# Patient Record
Sex: Female | Born: 1945 | Race: White | Hispanic: No | Marital: Married | Smoking: Never smoker
Health system: Southern US, Community
[De-identification: ages and names within clinical notes are randomized; demographics above are authoritative.]

## PROBLEM LIST (undated history)

## (undated) DIAGNOSIS — E7800 Pure hypercholesterolemia, unspecified: Secondary | ICD-10-CM

## (undated) DIAGNOSIS — J309 Allergic rhinitis, unspecified: Secondary | ICD-10-CM

## (undated) HISTORY — PX: APPENDECTOMY: SHX54

---

## 1999-01-28 ENCOUNTER — Other Ambulatory Visit: Admission: RE | Admit: 1999-01-28 | Discharge: 1999-01-28 | Payer: Self-pay | Admitting: Gynecology

## 2002-01-20 ENCOUNTER — Other Ambulatory Visit: Admission: RE | Admit: 2002-01-20 | Discharge: 2002-01-20 | Payer: Self-pay | Admitting: Gynecology

## 2003-03-29 ENCOUNTER — Other Ambulatory Visit: Admission: RE | Admit: 2003-03-29 | Discharge: 2003-03-29 | Payer: Self-pay | Admitting: Gynecology

## 2004-04-04 ENCOUNTER — Other Ambulatory Visit: Admission: RE | Admit: 2004-04-04 | Discharge: 2004-04-04 | Payer: Self-pay | Admitting: Gynecology

## 2005-06-06 ENCOUNTER — Other Ambulatory Visit: Admission: RE | Admit: 2005-06-06 | Discharge: 2005-06-06 | Payer: Self-pay | Admitting: Gynecology

## 2007-06-25 ENCOUNTER — Other Ambulatory Visit: Payer: Self-pay

## 2007-06-25 ENCOUNTER — Emergency Department: Payer: Self-pay | Admitting: Internal Medicine

## 2007-06-28 ENCOUNTER — Ambulatory Visit: Payer: Self-pay | Admitting: Cardiology

## 2007-06-30 ENCOUNTER — Encounter: Payer: Self-pay | Admitting: Cardiology

## 2007-06-30 ENCOUNTER — Ambulatory Visit: Payer: Self-pay

## 2011-02-25 NOTE — Assessment & Plan Note (Signed)
[facility] OFFICE NOTE   NAME:Hammond, Caroline                        MRN:          [id number]  DATE:06/28/2007                            DOB:          [birth year]    I was asked by Dr. Mindi Junker to consult on Caroline Hammond, a very pleasant  65 year old married white female for syncope.   This past Friday on June 25, 2007, she was doing her regular  workout at [employer]. She had done the treadmill, was doing weights.  After a particular set of weights she felt dizzy. She also felt a little  short of breath. She then had a syncopal event.   She came to, she was clear. She was kind of in and out before EMS  arrived. She had no seizure activity noted.   She was taken to [facility]. Her evaluation there  included a head CT which showed no acute abnormality, blood work  including a CBC, chemistry profile, and urinalysis, D-Dimer, and  troponin. Specifically, her troponin and D-Dimer was negative. Her CBC  was normal. Her chemistry showed that she probably was prerenal with a  BUN of 19 and a creatinine of 0.9. Of note she had not eaten breakfast  that morning which was her routine. Her urinalysis showed some  microscopic hematuria. She has had a history of interstitial cystitis.   The EKG was completely normal.   She has no previous history of syncope except when her son was like 12  years old getting stitched up.   PAST MEDICAL HISTORY:  She has no primary care physician. She has no dye  allergies. She is on no medication. She has no allergy to medications.   She does not smoke and does not use any over-the-counter medications.  She drinks maybe 1 or 2 glasses of wine a month. She drinks socially  caffeine.   She exercises about 5 days a week.   FAMILY HISTORY:  Negative for premature coronary disease.   Her only surgery is an appendectomy in 1955.   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   She is married and has 2 children.   REVIEW OF SYSTEMS:  Other than the HPI is totally negative.   PHYSICAL EXAMINATION:  Her blood pressure was 116/82, with a pulse of 74  and regular. Her blood pressure increased to 132/78 sitting with a heart  rate of 73, standing 138/89 and 74, and after 2 minutes dropped to  128/86 heart rate of 76. She is 5 feet 4 inches, weighs 186 pounds.  IN GENERAL: She is in no acute distress. She looks younger than her  stated age.  HEENT: Normocephalic, atraumatic, PERRLA: Extraocular movements intact.  Sclera clear. Facial symmetry normal.  NECK: Supple, carotids are full without bruits. There is no JVD. Thyroid  is not enlarged. Trachea is midline.  LUNGS: Clear.  HEART: Reveals a nondisplaced PMI. She has normal S1, S2.  ABDOMINAL EXAM: Soft, good bowel sounds. No midline bruits.  EXTREMITIES: Reveal  no edema. There is no sign of DVT. Pulses are  intact.  NEURO EXAM: Grossly intact.  SKIN: Unremarkable.   Please note in the emergency that her systolic pressures on several  occasions were between 60 and 80 systolic for a pretty good period of  time.   ASSESSMENT:  1. Vasovagal syncope probably in the setting of dehydration and      possibly even hypoglycemia from not eating breakfast.  2. History of remote syncope which may suggest a neurogenic or      neurocardiogenic component to this.  3. No significant orthostatic changes in the office other than after      standing for 2 minutes which were borderline in significance.   PLAN:  1. Patient counseled on the pathogenesis of neurocardiogenic syncope      or vasovagal syncope. She has been instructed and educated on      predisposing factors including dehydration and not eating. She      gained a good understanding of this during our visit.  2. A 2D echocardiogram to rule out any structural heart disease.   Assuming that #2  is normal and she has no further events, we will see  her back on a p.r.n. basis.     Thomas C. Daleen Squibb, MD, [facility]  Electronically Signed    TCW/MedQ  DD: 06/28/2007  DT: 06/28/2007  Job #: [id number]   cc:   [place]

## 2011-09-24 ENCOUNTER — Emergency Department: Payer: Self-pay | Admitting: Unknown Physician Specialty

## 2012-04-12 DEATH — deceased

## 2014-02-15 ENCOUNTER — Ambulatory Visit: Payer: Self-pay | Admitting: Family Medicine

## 2015-01-31 DIAGNOSIS — E784 Other hyperlipidemia: Secondary | ICD-10-CM | POA: Diagnosis not present

## 2015-01-31 DIAGNOSIS — Z131 Encounter for screening for diabetes mellitus: Secondary | ICD-10-CM | POA: Diagnosis not present

## 2015-02-05 DIAGNOSIS — E7800 Pure hypercholesterolemia, unspecified: Secondary | ICD-10-CM | POA: Insufficient documentation

## 2015-02-05 DIAGNOSIS — Z1211 Encounter for screening for malignant neoplasm of colon: Secondary | ICD-10-CM | POA: Diagnosis not present

## 2015-02-05 DIAGNOSIS — Z0000 Encounter for general adult medical examination without abnormal findings: Secondary | ICD-10-CM | POA: Diagnosis not present

## 2015-02-05 DIAGNOSIS — Z131 Encounter for screening for diabetes mellitus: Secondary | ICD-10-CM | POA: Diagnosis not present

## 2015-02-05 DIAGNOSIS — Z23 Encounter for immunization: Secondary | ICD-10-CM | POA: Diagnosis not present

## 2015-02-05 DIAGNOSIS — J302 Other seasonal allergic rhinitis: Secondary | ICD-10-CM | POA: Insufficient documentation

## 2015-05-16 DIAGNOSIS — E780 Pure hypercholesterolemia: Secondary | ICD-10-CM | POA: Diagnosis not present

## 2015-05-16 DIAGNOSIS — Z79899 Other long term (current) drug therapy: Secondary | ICD-10-CM | POA: Diagnosis not present

## 2015-05-24 DIAGNOSIS — Z131 Encounter for screening for diabetes mellitus: Secondary | ICD-10-CM | POA: Diagnosis not present

## 2015-05-24 DIAGNOSIS — E780 Pure hypercholesterolemia: Secondary | ICD-10-CM | POA: Diagnosis not present

## 2015-06-06 ENCOUNTER — Encounter: Payer: Self-pay | Admitting: *Deleted

## 2015-06-07 ENCOUNTER — Encounter
Admission: RE | Disposition: A | Discharge status: Home / Self Care | Payer: Self-pay | Source: Ambulatory Visit | Attending: Gastroenterology

## 2015-06-07 ENCOUNTER — Encounter: Payer: Self-pay | Admitting: Anesthesiology

## 2015-06-07 ENCOUNTER — Ambulatory Visit
Admission: RE | Admit: 2015-06-07 | Discharge: 2015-06-07 | Disposition: A | Discharge status: Home / Self Care | Payer: Medicare Other | Source: Ambulatory Visit | Attending: Gastroenterology | Admitting: Gastroenterology

## 2015-06-07 ENCOUNTER — Ambulatory Visit: Payer: Medicare Other | Admitting: Anesthesiology

## 2015-06-07 DIAGNOSIS — Q438 Other specified congenital malformations of intestine: Secondary | ICD-10-CM | POA: Insufficient documentation

## 2015-06-07 DIAGNOSIS — Z1211 Encounter for screening for malignant neoplasm of colon: Secondary | ICD-10-CM | POA: Insufficient documentation

## 2015-06-07 DIAGNOSIS — Z86010 Personal history of colonic polyps: Secondary | ICD-10-CM | POA: Diagnosis not present

## 2015-06-07 DIAGNOSIS — K5730 Diverticulosis of large intestine without perforation or abscess without bleeding: Secondary | ICD-10-CM | POA: Diagnosis not present

## 2015-06-07 DIAGNOSIS — Z800 Family history of malignant neoplasm of digestive organs: Secondary | ICD-10-CM | POA: Insufficient documentation

## 2015-06-07 DIAGNOSIS — J309 Allergic rhinitis, unspecified: Secondary | ICD-10-CM | POA: Diagnosis not present

## 2015-06-07 DIAGNOSIS — K5790 Diverticulosis of intestine, part unspecified, without perforation or abscess without bleeding: Secondary | ICD-10-CM | POA: Diagnosis not present

## 2015-06-07 DIAGNOSIS — E780 Pure hypercholesterolemia: Secondary | ICD-10-CM | POA: Diagnosis not present

## 2015-06-07 DIAGNOSIS — Z8371 Family history of colonic polyps: Secondary | ICD-10-CM | POA: Diagnosis not present

## 2015-06-07 HISTORY — DX: Allergic rhinitis, unspecified: J30.9

## 2015-06-07 HISTORY — DX: Pure hypercholesterolemia, unspecified: E78.00

## 2015-06-07 HISTORY — PX: COLONOSCOPY WITH PROPOFOL: SHX5780

## 2015-06-07 SURGERY — COLONOSCOPY WITH PROPOFOL
Anesthesia: General

## 2015-06-07 MED ORDER — SODIUM CHLORIDE 0.9 % IV SOLN: INTRAVENOUS | Status: DC

## 2015-06-07 MED ORDER — MIDAZOLAM HCL 2 MG/2ML IJ SOLN
INTRAMUSCULAR | Status: DC | PRN
  Administered 2015-06-07: 1 mg via INTRAVENOUS

## 2015-06-07 MED ORDER — EPHEDRINE SULFATE 50 MG/ML IJ SOLN
INTRAMUSCULAR | Status: DC | PRN
  Administered 2015-06-07: 10 mg via INTRAVENOUS
  Administered 2015-06-07 (×2): 5 mg via INTRAVENOUS

## 2015-06-07 MED ORDER — PROPOFOL INFUSION 10 MG/ML OPTIME
INTRAVENOUS | Status: DC | PRN
  Administered 2015-06-07: 100 ug/kg/min via INTRAVENOUS

## 2015-06-07 MED ORDER — FENTANYL CITRATE (PF) 100 MCG/2ML IJ SOLN
INTRAMUSCULAR | Status: DC | PRN
  Administered 2015-06-07: 50 ug via INTRAVENOUS

## 2015-06-07 MED ORDER — SODIUM CHLORIDE 0.9 % IV SOLN
INTRAVENOUS | Status: DC
  Administered 2015-06-07: 1000 mL via INTRAVENOUS

## 2015-06-07 NOTE — Op Note (Signed)
[facility] Gastroenterology Patient Name: Caroline Hammond Procedure Date: 06/07/2015 8:07 AM MRN: [id number] Account #: [id number] Date of Birth: October 09, 1946 Admit Type: Outpatient Age: 69 Room: [facility] ENDO ROOM 3 Gender: Female Note Status: Finalized Procedure:         Colonoscopy Indications:       Family history of colon cancer in a first-degree relative,                     Family history of colonic polyps in a first-degree relative Providers:         Christena Deem, MD Referring MD:      Hassell Halim (Referring MD) Medicines:         Monitored Anesthesia Care Complications:     No immediate complications. Procedure:         Pre-Anesthesia Assessment:                    - ASA Grade Assessment: II - A patient with mild systemic                     disease.                    After obtaining informed consent, the colonoscope was                     passed under direct vision. Throughout the procedure, the                     patient's blood pressure, pulse, and oxygen saturations                     were monitored continuously. The Colonoscope was                     introduced through the anus and advanced to the the cecum,                     identified by appendiceal orifice and ileocecal valve. The                     colonoscopy was performed with moderate difficulty due to                     a tortuous colon. Successful completion of the procedure                     was aided by changing the patient to a supine position.                     The patient tolerated the procedure well. The quality of                     the bowel preparation was good. Findings:      The sigmoid colon and distal descending colon were significantly       redundant.      Multiple small and large-mouthed diverticula were found in the sigmoid       colon and in the descending colon.      The retroflexed view of the distal rectum and anal verge was normal and       showed  no anal or rectal abnormalities.      The exam was otherwise without abnormality.  The digital rectal exam was normal. Impression:        - Redundant colon.                    - Diverticulosis in the sigmoid colon and in the                     descending colon.                    - The distal rectum and anal verge are normal on                     retroflexion view.                    - The examination was otherwise normal.                    - No specimens collected. Recommendation:    - Repeat colonoscopy in 5 years for screening purposes. Procedure Code(s): --- Professional ---                    [phone], Colonoscopy, flexible; diagnostic, including                     collection of specimen(s) by brushing or washing, when                     performed (separate procedure) Diagnosis Code(s): --- Professional ---                    V16.0, Family history of malignant neoplasm of                     gastrointestinal tract                    V18.51, Family history of colonic polyps                    562.10, Diverticulosis of colon (without mention of                     hemorrhage)                    751.5, Other anomalies of intestine CPT copyright 2014 American Medical Association. All rights reserved. The codes documented in this report are preliminary and upon coder review may  be revised to meet current compliance requirements. Christena Deem, MD 06/07/2015 8:41:52 AM This report has been signed electronically. Number of Addenda: 0 Note Initiated On: 06/07/2015 8:07 AM Scope Withdrawal Time: 0 hours 7 minutes 48 seconds  Total Procedure Duration: 0 hours 19 minutes 14 seconds       [facility]

## 2015-06-07 NOTE — Transfer of Care (Signed)
Immediate Anesthesia Transfer of Care Note  Patient: Caroline Hammond  Procedure(s) Performed: Procedure(s): COLONOSCOPY WITH PROPOFOL (N/A)  Patient Location: PACU  Anesthesia Type:General  Level of Consciousness: awake and sedated  Airway & Oxygen Therapy: Patient Spontanous Breathing and Patient connected to nasal cannula oxygen  Post-op Assessment: Report given to RN and Post -op Vital signs reviewed and stable  Post vital signs: Reviewed and stable  Last Vitals:  Filed Vitals:   06/07/15 0713  BP: 129/54  Pulse:   Resp: 16    Complications: No apparent anesthesia complications

## 2015-06-07 NOTE — H&P (Signed)
Outpatient short stay form Pre-procedure 06/07/2015 8:01 AM Caroline Deem MD  Primary Physician: Dr. Kandyce Rud  Reason for visit:  Colonoscopy  History of present illness:  Patient is a 69 year old female presenting for colonoscopy. She has a family history of colon cancer in a sister as well as family history of colon polyps in mother. She tolerated her prep well for the procedure. She takes no aspirin or blood thinning products.    Current facility-administered medications:  .  0.9 %  sodium chloride infusion, , Intravenous, Continuous, Caroline Deem, MD, Last Rate: 20 mL/hr at 06/07/15 0722, 1,000 mL at 06/07/15 0722 .  0.9 %  sodium chloride infusion, , Intravenous, Continuous, Caroline Deem, MD  Prescriptions prior to admission  Medication Sig Dispense Refill Last Dose  . atorvastatin (LIPITOR) 10 MG tablet Take 10 mg by mouth daily.   Not Taking at Unknown time  . fluticasone (VERAMYST) 27.5 MCG/SPRAY nasal spray Place 2 sprays into the nose daily.   Not Taking at Unknown time     Not on File   Past Medical History  Diagnosis Date  . Pure hypercholesterolemia   . Allergic rhinitis     Review of systems:      Physical Exam    Heart and lungs: Regular rate and rhythm without rub or gallop, lungs are bilaterally clear.    HEENT: Normocephalic atraumatic eyes are anicteric    Other:     Pertinant exam for procedure: Soft nontender nondistended bowel sounds positive normoactive    Planned proceedures: Colonoscopy and indicated procedures. I have discussed the risks benefits and complications of procedures to include not limited to bleeding, infection, perforation and the risk of sedation and the patient wishes to proceed.  Caroline Deem, MD Gastroenterology 06/07/2015  8:01 AM

## 2015-06-07 NOTE — Anesthesia Preprocedure Evaluation (Signed)
Anesthesia Evaluation  Patient identified by MRN, date of birth, ID band Patient awake    Reviewed: Allergy & Precautions, NPO status , Patient's Chart, lab work & pertinent test results, reviewed documented beta blocker date and time   Airway Mallampati: II  TM Distance: >3 FB     Dental  (+) Chipped   Pulmonary          Cardiovascular     Neuro/Psych    GI/Hepatic   Endo/Other    Renal/GU      Musculoskeletal   Abdominal   Peds  Hematology   Anesthesia Other Findings   Reproductive/Obstetrics                             Anesthesia Physical Anesthesia Plan  ASA: II  Anesthesia Plan: General   Post-op Pain Management:    Induction: Intravenous  Airway Management Planned: Nasal Cannula  Additional Equipment:   Intra-op Plan:   Post-operative Plan:   Informed Consent: I have reviewed the patients History and Physical, chart, labs and discussed the procedure including the risks, benefits and alternatives for the proposed anesthesia with the patient or authorized representative who has indicated his/her understanding and acceptance.     Plan Discussed with: CRNA  Anesthesia Plan Comments:         Anesthesia Quick Evaluation  

## 2015-06-07 NOTE — Anesthesia Postprocedure Evaluation (Signed)
  Anesthesia Post-op Note  Patient: Caroline Hammond  Procedure(s) Performed: Procedure(s): COLONOSCOPY WITH PROPOFOL (N/A)  Anesthesia type:General  Patient location: PACU  Post pain: Pain level controlled  Post assessment: Post-op Vital signs reviewed, Patient's Cardiovascular Status Stable, Respiratory Function Stable, Patent Airway and No signs of Nausea or vomiting  Post vital signs: Reviewed and stable  Last Vitals:  Filed Vitals:   06/07/15 0920  BP: 104/51  Pulse: 74  Temp:   Resp: 17    Level of consciousness: awake, alert  and patient cooperative  Complications: No apparent anesthesia complications

## 2015-06-07 NOTE — Anesthesia Procedure Notes (Signed)
Performed by: Tonia Ghent Pre-anesthesia Checklist: Patient identified, Emergency Drugs available, Suction available, Timeout performed and Patient being monitored Patient Re-evaluated:Patient Re-evaluated prior to inductionOxygen Delivery Method: Nasal cannula Preoxygenation: Pre-oxygenation with 100% oxygen Intubation Type: IV induction Endobronchial tube: Left Placement Confirmation: CO2 detector and positive ETCO2

## 2015-06-08 ENCOUNTER — Encounter: Payer: Self-pay | Admitting: Gastroenterology

## 2015-10-24 DIAGNOSIS — H524 Presbyopia: Secondary | ICD-10-CM | POA: Diagnosis not present

## 2015-10-24 DIAGNOSIS — H52223 Regular astigmatism, bilateral: Secondary | ICD-10-CM | POA: Diagnosis not present

## 2015-10-24 DIAGNOSIS — H5203 Hypermetropia, bilateral: Secondary | ICD-10-CM | POA: Diagnosis not present

## 2016-02-05 DIAGNOSIS — E7800 Pure hypercholesterolemia, unspecified: Secondary | ICD-10-CM | POA: Diagnosis not present

## 2016-02-05 DIAGNOSIS — Z131 Encounter for screening for diabetes mellitus: Secondary | ICD-10-CM | POA: Diagnosis not present

## 2016-02-12 DIAGNOSIS — Z131 Encounter for screening for diabetes mellitus: Secondary | ICD-10-CM | POA: Diagnosis not present

## 2016-02-12 DIAGNOSIS — Z0000 Encounter for general adult medical examination without abnormal findings: Secondary | ICD-10-CM | POA: Diagnosis not present

## 2016-02-12 DIAGNOSIS — E7800 Pure hypercholesterolemia, unspecified: Secondary | ICD-10-CM | POA: Diagnosis not present

## 2016-07-22 ENCOUNTER — Other Ambulatory Visit: Payer: Self-pay | Admitting: Family Medicine

## 2016-07-22 DIAGNOSIS — Z1231 Encounter for screening mammogram for malignant neoplasm of breast: Secondary | ICD-10-CM

## 2016-08-13 DIAGNOSIS — Z131 Encounter for screening for diabetes mellitus: Secondary | ICD-10-CM | POA: Diagnosis not present

## 2016-08-13 DIAGNOSIS — E7800 Pure hypercholesterolemia, unspecified: Secondary | ICD-10-CM | POA: Diagnosis not present

## 2016-08-18 ENCOUNTER — Ambulatory Visit: Payer: Medicare Other

## 2016-08-19 ENCOUNTER — Other Ambulatory Visit: Payer: Self-pay | Admitting: Family Medicine

## 2016-08-19 ENCOUNTER — Ambulatory Visit
Admission: RE | Admit: 2016-08-19 | Discharge: 2016-08-19 | Disposition: A | Discharge status: Home / Self Care | Payer: Medicare Other | Source: Ambulatory Visit | Attending: Family Medicine | Admitting: Family Medicine

## 2016-08-19 DIAGNOSIS — Z1231 Encounter for screening mammogram for malignant neoplasm of breast: Secondary | ICD-10-CM | POA: Insufficient documentation

## 2016-08-19 DIAGNOSIS — E7800 Pure hypercholesterolemia, unspecified: Secondary | ICD-10-CM | POA: Diagnosis not present

## 2016-08-19 DIAGNOSIS — Z131 Encounter for screening for diabetes mellitus: Secondary | ICD-10-CM | POA: Diagnosis not present

## 2017-01-05 ENCOUNTER — Encounter: Payer: Self-pay | Admitting: *Deleted

## 2017-01-06 NOTE — Discharge Instructions (Signed)
[facility] °[facility] °ENDOSCOPIC SINUS SURGERY °[facility] ° °What is Functional Endoscopic Sinus Surgery? ° The Surgery involves making the natural openings of the sinuses larger by removing the bony partitions that separate the sinuses from the nasal cavity.  The natural sinus lining is preserved as much as possible to allow the sinuses to resume normal function after the surgery.  In some patients nasal polyps (excessively swollen lining of the sinuses) may be removed to relieve obstruction of the sinus openings.  The surgery is performed through the nose using lighted scopes, which eliminates the need for incisions on the face.  A septoplasty is a different procedure which is sometimes performed with sinus surgery.  It involves straightening the boy partition that separates the two sides of your nose.  A crooked or deviated septum may need repair if is obstructing the sinuses or nasal airflow.  Turbinate reduction is also often performed during sinus surgery.  The turbinates are bony proturberances from the side walls of the nose which swell and can obstruct the nose in patients with sinus and allergy problems.  Their size can be surgically reduced to help relieve nasal obstruction. ° °What Can Sinus Surgery Do For Me? ° Sinus surgery can reduce the frequency of sinus infections requiring antibiotic treatment.  This can provide improvement in nasal congestion, post-nasal drainage, facial pressure and nasal obstruction.  Surgery will NOT prevent you from ever having an infection again, so it usually only for patients who get infections 4 or more times yearly requiring antibiotics, or for infections that do not clear with antibiotics.  It will not cure nasal allergies, so patients with allergies may still require medication to treat their allergies after surgery. Surgery may improve headaches related to sinusitis, however, some people will continue to  require medication to control sinus headaches related to allergies.  Surgery will do nothing for other forms of headache (migraine, tension or cluster). ° °What Are the Risks of Endoscopic Sinus Surgery? ° Current techniques allow surgery to be performed safely with little risk, however, there are rare complications that patients should be aware of.  Because the sinuses are located around the eyes, there is risk of eye injury, including blindness, though again, this would be quite rare. This is usually a result of bleeding behind the eye during surgery, which puts the vision oat risk, though there are treatments to protect the vision and prevent permanent disrupted by surgery causing a leak of the spinal fluid that surrounds the brain.  More serious complications would include bleeding inside the brain cavity or damage to the brain.  Again, all of these complications are uncommon, and spinal fluid leaks can be safely managed surgically if they occur.  The most common complication of sinus surgery is bleeding from the nose, which may require packing or cauterization of the nose.  Continued sinus have polyps may experience recurrence of the polyps requiring revision surgery.  Alterations of sense of smell or injury to the tear ducts are also rare complications.  ° °What is the Surgery Like, and what is the Recovery? ° The Surgery usually takes a couple of hours to perform, and is usually performed under a general anesthetic (completely asleep).  Patients are usually discharged home after a couple of hours.  Sometimes during surgery it is necessary to pack the nose to control bleeding, and the packing is left in place for 24 - 48 hours, and removed by your surgeon.    If a septoplasty was performed during the procedure, there is often a splint placed which must be removed after 5-7 days.   °Discomfort: Pain is usually mild to moderate, and can be controlled by prescription pain medication or acetaminophen (Tylenol).   Aspirin, Ibuprofen (Advil, Motrin), or Naprosyn (Aleve) should be avoided, as they can cause increased bleeding.  Most patients feel sinus pressure like they have a bad head cold for several days.  Sleeping with your head elevated can help reduce swelling and facial pressure, as can ice packs over the face.  A humidifier may be helpful to keep the mucous and blood from drying in the nose.  ° °Diet: There are no specific diet restrictions, however, you should generally start with clear liquids and a light diet of bland foods because the anesthetic can cause some nausea.  Advance your diet depending on how your stomach feels.  Taking your pain medication with food will often help reduce stomach upset which pain medications can cause. ° °Nasal Saline Irrigation: It is important to remove blood clots and dried mucous from the nose as it is healing.  This is done by having you irrigate the nose at least 3 - 4 times daily with a salt water solution.  We recommend using NeilMed Sinus Rinse (available at the drug store).  Fill the squeeze bottle with the solution, bend over a sink, and insert the tip of the squeeze bottle into the nose ½ of an inch.  Point the tip of the squeeze bottle towards the inside corner of the eye on the same side your irrigating.  Squeeze the bottle and gently irrigate the nose.  If you bend forward as you do this, most of the fluid will flow back out of the nose, instead of down your throat.   The solution should be warm, near body temperature, when you irrigate.   Each time you irrigate, you should use a full squeeze bottle.  ° °Note that if you are instructed to use Nasal Steroid Sprays at any time after your surgery, irrigate with saline BEFORE using the steroid spray, so you do not wash it all out of the nose. °Another product, Nasal Saline Gel (such as AYR Nasal Saline Gel) can be applied in each nostril 3 - 4 times daily to moisture the nose and reduce scabbing or crusting. ° °Bleeding:   Bloody drainage from the nose can be expected for several days, and patients are instructed to irrigate their nose frequently with salt water to help remove mucous and blood clots.  The drainage may be dark red or brown, though some fresh blood may be seen intermittently, especially after irrigation.  Do not blow you nose, as bleeding may occur. If you must sneeze, keep your mouth open to allow air to escape through your mouth. ° °If heavy bleeding occurs: Irrigate the nose with saline to rinse out clots, then spray the nose 3 - 4 times with Afrin Nasal Decongestant Spray.  The spray will constrict the blood vessels to slow bleeding.  Pinch the lower half of your nose shut to apply pressure, and lay down with your head elevated.  Ice packs over the nose may help as well. If bleeding persists despite these measures, you should notify your doctor.  Do not use the Afrin routinely to control nasal congestion after surgery, as it can result in worsening congestion and may affect healing.  ° °Activity: Return to work varies among patients. Most patients will be out of   work at least 5 - 7 days to recover.  Patient may return to work after they are off of narcotic pain medication, and feeling well enough to perform the functions of their job.  Patients must avoid heavy lifting (over 10 pounds) or strenuous physical for 2 weeks after surgery, so your employer may need to assign you to light duty, or keep you out of work longer if light duty is not possible.  NOTE: you should not drive, operate dangerous machinery, do any mentally demanding tasks or make any important legal or financial decisions while on narcotic pain medication and recovering from the general anesthetic.  °  °Call Your Doctor Immediately if You Have Any of the Following: °1. Bleeding that you cannot control with the above measures °2. Loss of vision, double vision, bulging of the eye or black eyes. °3. Fever over 101 degrees °4. Neck stiffness with severe  headache, fever, nausea and change in mental state. °You are always encourage to call anytime with concerns, however, please call with requests for pain medication refills during office hours. ° °Office Endoscopy: During follow-up visits your doctor will remove any packing or splints that may have been placed and evaluate and clean your sinuses endoscopically.  Topical anesthetic will be used to make this as comfortable as possible, though you may want to take your pain medication prior to the visit.  How often this will need to be done varies from patient to patient.  After complete recovery from the surgery, you may need follow-up endoscopy from time to time, particularly if there is concern of recurrent infection or nasal polyps. ° ° °General Anesthesia, Adult, Care After °These instructions provide you with information about caring for yourself after your procedure. Your health care provider may also give you more specific instructions. Your treatment has been planned according to current medical practices, but problems sometimes occur. Call your health care provider if you have any problems or questions after your procedure. °What can I expect after the procedure? °After the procedure, it is common to have: °· Vomiting. °· A sore throat. °· Mental slowness. °It is common to feel: °· Nauseous. °· Cold or shivery. °· Sleepy. °· Tired. °· Sore or achy, even in parts of your body where you did not have surgery. °Follow these instructions at home: °For at least 24 hours after the procedure: °· Do not: °¨ Participate in activities where you could fall or become injured. °¨ Drive. °¨ Use heavy machinery. °¨ Drink alcohol. °¨ Take sleeping pills or medicines that cause drowsiness. °¨ Make important decisions or sign legal documents. °¨ Take care of children on your own. °· Rest. °Eating and drinking °· If you vomit, drink water, juice, or soup when you can drink without vomiting. °· Drink enough fluid to keep your  urine clear or pale yellow. °· Make sure you have little or no nausea before eating solid foods. °· Follow the diet recommended by your health care provider. °General instructions °· Have a responsible adult stay with you until you are awake and alert. °· Return to your normal activities as told by your health care provider. Ask your health care provider what activities are safe for you. °· Take over-the-counter and prescription medicines only as told by your health care provider. °· If you smoke, do not smoke without supervision. °· Keep all follow-up visits as told by your health care provider. This is important. °Contact a health care provider if: °· You continue to have nausea   or vomiting at home, and medicines are not helpful. °· You cannot drink fluids or start eating again. °· You cannot urinate after 8-12 hours. °· You develop a skin rash. °· You have fever. °· You have increasing redness at the site of your procedure. °Get help right away if: °· You have difficulty breathing. °· You have chest pain. °· You have unexpected bleeding. °· You feel that you are having a life-threatening or urgent problem. °This information is not intended to replace advice given to you by your health care provider. Make sure you discuss any questions you have with your health care provider. °Document Released: 01/05/2001 Document Revised: 03/03/2016 Document Reviewed: 09/13/2015 °[employer] Interactive Patient Education © 2017 [employer] ° °

## 2017-01-08 ENCOUNTER — Encounter
Admission: RE | Disposition: A | Discharge status: Home / Self Care | Payer: Self-pay | Source: Ambulatory Visit | Attending: Otolaryngology

## 2017-01-08 ENCOUNTER — Ambulatory Visit: Payer: Medicare Other | Admitting: Anesthesiology

## 2017-01-08 ENCOUNTER — Ambulatory Visit
Admission: RE | Admit: 2017-01-08 | Discharge: 2017-01-08 | Disposition: A | Discharge status: Home / Self Care | Payer: Medicare Other | Source: Ambulatory Visit | Attending: Otolaryngology | Admitting: Otolaryngology

## 2017-01-08 DIAGNOSIS — Z9889 Other specified postprocedural states: Secondary | ICD-10-CM | POA: Insufficient documentation

## 2017-01-08 DIAGNOSIS — J343 Hypertrophy of nasal turbinates: Secondary | ICD-10-CM | POA: Diagnosis not present

## 2017-01-08 DIAGNOSIS — Z79899 Other long term (current) drug therapy: Secondary | ICD-10-CM | POA: Diagnosis not present

## 2017-01-08 DIAGNOSIS — M950 Acquired deformity of nose: Secondary | ICD-10-CM | POA: Insufficient documentation

## 2017-01-08 DIAGNOSIS — Z83511 Family history of glaucoma: Secondary | ICD-10-CM | POA: Diagnosis not present

## 2017-01-08 DIAGNOSIS — J342 Deviated nasal septum: Secondary | ICD-10-CM | POA: Insufficient documentation

## 2017-01-08 DIAGNOSIS — Z8349 Family history of other endocrine, nutritional and metabolic diseases: Secondary | ICD-10-CM | POA: Diagnosis not present

## 2017-01-08 HISTORY — PX: SEPTOPLASTY: SHX2393

## 2017-01-08 HISTORY — PX: TURBINATE REDUCTION: SHX6157

## 2017-01-08 SURGERY — SEPTOPLASTY, NOSE
Anesthesia: General | Site: Nose | Laterality: Bilateral | Wound class: Clean Contaminated

## 2017-01-08 MED ORDER — LIDOCAINE HCL (CARDIAC) 20 MG/ML IV SOLN
INTRAVENOUS | Status: DC | PRN
  Administered 2017-01-08: 50 mg via INTRAVENOUS

## 2017-01-08 MED ORDER — DEXTROSE 5 % IV SOLN
2000.0000 mg | Freq: Once | INTRAVENOUS | Status: AC
  Administered 2017-01-08: 2000 mg via INTRAVENOUS

## 2017-01-08 MED ORDER — LIDOCAINE-EPINEPHRINE 1 %-1:100000 IJ SOLN
INTRAMUSCULAR | Status: DC | PRN
  Administered 2017-01-08: 2 mL
  Administered 2017-01-08: 4 mL

## 2017-01-08 MED ORDER — MIDAZOLAM HCL 5 MG/5ML IJ SOLN
INTRAMUSCULAR | Status: DC | PRN
  Administered 2017-01-08: 2 mg via INTRAVENOUS

## 2017-01-08 MED ORDER — SUCCINYLCHOLINE CHLORIDE 20 MG/ML IJ SOLN
INTRAMUSCULAR | Status: DC | PRN
  Administered 2017-01-08: 80 mg via INTRAVENOUS

## 2017-01-08 MED ORDER — DEXAMETHASONE SODIUM PHOSPHATE 4 MG/ML IJ SOLN
INTRAMUSCULAR | Status: DC | PRN
  Administered 2017-01-08: 8 mg via INTRAVENOUS

## 2017-01-08 MED ORDER — OXYCODONE HCL 5 MG PO TABS
5.0000 mg | ORAL_TABLET | Freq: Once | ORAL | Status: AC | PRN
  Administered 2017-01-08: 5 mg via ORAL

## 2017-01-08 MED ORDER — ONDANSETRON HCL 4 MG/2ML IJ SOLN: 4.0000 mg | Freq: Once | INTRAMUSCULAR | Status: DC | PRN

## 2017-01-08 MED ORDER — FENTANYL CITRATE (PF) 100 MCG/2ML IJ SOLN
INTRAMUSCULAR | Status: DC | PRN
  Administered 2017-01-08 (×2): 50 ug via INTRAVENOUS

## 2017-01-08 MED ORDER — LACTATED RINGERS IV SOLN
INTRAVENOUS | Status: DC
  Administered 2017-01-08: 11:00:00 via INTRAVENOUS

## 2017-01-08 MED ORDER — LIDOCAINE HCL 1 % IJ SOLN
INTRAMUSCULAR | Status: DC | PRN
  Administered 2017-01-08: 5 mL via TOPICAL

## 2017-01-08 MED ORDER — FENTANYL CITRATE (PF) 100 MCG/2ML IJ SOLN
25.0000 ug | INTRAMUSCULAR | Status: DC | PRN
Start: 2017-01-08 — End: 2017-01-08

## 2017-01-08 MED ORDER — ONDANSETRON HCL 4 MG/2ML IJ SOLN
INTRAMUSCULAR | Status: DC | PRN
  Administered 2017-01-08: 4 mg via INTRAVENOUS

## 2017-01-08 MED ORDER — OXYMETAZOLINE HCL 0.05 % NA SOLN
2.0000 | Freq: Once | NASAL | Status: AC
  Administered 2017-01-08: 2 via NASAL

## 2017-01-08 MED ORDER — EPHEDRINE SULFATE 50 MG/ML IJ SOLN
INTRAMUSCULAR | Status: DC | PRN
  Administered 2017-01-08 (×3): 10 mg via INTRAVENOUS

## 2017-01-08 MED ORDER — PROPOFOL 10 MG/ML IV BOLUS
INTRAVENOUS | Status: DC | PRN
  Administered 2017-01-08: 100 mg via INTRAVENOUS
  Administered 2017-01-08: 50 mg via INTRAVENOUS

## 2017-01-08 MED ORDER — ACETAMINOPHEN 10 MG/ML IV SOLN
1000.0000 mg | Freq: Once | INTRAVENOUS | Status: AC
  Administered 2017-01-08: 1000 mg via INTRAVENOUS

## 2017-01-08 MED ORDER — OXYCODONE HCL 5 MG/5ML PO SOLN: 5.0000 mg | Freq: Once | ORAL | Status: AC | PRN

## 2017-01-08 SURGICAL SUPPLY — 44 items
CANISTER SUCT 1200ML W/VALVE (MISCELLANEOUS) ×2 IMPLANT
CATH IV 18X1 1/4 SAFELET (CATHETERS) ×2 IMPLANT
COAG SUCT 10F 3.5MM HAND CTRL (MISCELLANEOUS) ×2 IMPLANT
COAGULATOR SUCT 8FR VV (MISCELLANEOUS) ×2 IMPLANT
DEVICE INFLATION 20/61 (MISCELLANEOUS) IMPLANT
DRAPE HEAD BAR (DRAPES) ×2 IMPLANT
DRESSING NASL FOAM PST OP SINU (MISCELLANEOUS) IMPLANT
DRSG NASAL 4CM NASOPORE (MISCELLANEOUS) IMPLANT
DRSG NASAL FOAM POST OP SINU (MISCELLANEOUS)
GLOVE PI ULTRA LF STRL 7.5 (GLOVE) ×2 IMPLANT
GLOVE PI ULTRA NON LATEX 7.5 (GLOVE) ×2
IMPLANT NASAL LATERA ABSORABLE (Miscellaneous) ×2 IMPLANT
IRRIGATOR 4MM STR (IRRIGATION / IRRIGATOR) ×2 IMPLANT
IV CATH 18X1 1/4 SAFELET (CATHETERS) ×1
IV NS 500ML (IV SOLUTION) ×2
IV NS 500ML BAXH (IV SOLUTION) ×1 IMPLANT
KIT ROOM TURNOVER OR (KITS) ×2 IMPLANT
NDL ANESTHESIA 27G X 3.5 (NEEDLE) ×1 IMPLANT
NDL HYPO 25GX1X1/2 BEV (NEEDLE) ×1 IMPLANT
NDL SPNL 25GX3.5 QUINCKE BL (NEEDLE) IMPLANT
NEEDLE ANESTHESIA  27G X 3.5 (NEEDLE) ×1
NEEDLE ANESTHESIA 27G X 3.5 (NEEDLE) ×1 IMPLANT
NEEDLE HYPO 25GX1X1/2 BEV (NEEDLE) ×2 IMPLANT
NEEDLE SPNL 25GX3.5 QUINCKE BL (NEEDLE) IMPLANT
NS IRRIG 500ML POUR BTL (IV SOLUTION) ×2 IMPLANT
PACK DRAPE NASAL/ENT (PACKS) ×2 IMPLANT
PACKING NASAL EPIS 4X2.4 XEROG (MISCELLANEOUS) IMPLANT
PAD GROUND ADULT SPLIT (MISCELLANEOUS) ×2 IMPLANT
PATTIES SURGICAL .5 X3 (DISPOSABLE) ×2 IMPLANT
SET HANDPIECE IRR DIEGO (MISCELLANEOUS) ×2 IMPLANT
SINUPLASTY BALLN CATHTIP (CATHETERS) IMPLANT
SOL ANTI-FOG 6CC FOG-OUT (MISCELLANEOUS) ×1 IMPLANT
SOL FOG-OUT ANTI-FOG 6CC (MISCELLANEOUS) ×1
SPLINT NASAL SEPTAL BLV .50 ST (MISCELLANEOUS) ×2 IMPLANT
STRAP BODY AND KNEE 60X3 (MISCELLANEOUS) ×2 IMPLANT
SUT CHROMIC 3-0 (SUTURE)
SUT CHROMIC 3-0 KS 27XMFL CR (SUTURE)
SUT ETHILON 3-0 KS 30 BLK (SUTURE) ×2 IMPLANT
SUT PLAIN GUT 4-0 (SUTURE) ×2 IMPLANT
SUTURE CHRMC 3-0 KS 27XMFL CR (SUTURE) IMPLANT
SYR 3ML LL SCALE MARK (SYRINGE) ×2 IMPLANT
SYSTEM BALLN SINUPLASTY 6X16 (BALLOONS) IMPLANT
TOWEL OR 17X26 4PK STRL BLUE (TOWEL DISPOSABLE) ×2 IMPLANT
WATER STERILE IRR 500ML POUR (IV SOLUTION) IMPLANT

## 2017-01-08 NOTE — Op Note (Signed)
01/08/2017  1:19 PM  [id number]   Pre-Op Dx:  Bilateral nasal valve collapse and Obstruction, Deviated Nasal Septum, Hypertrophic Inferior Turbinates  Post-op Dx: Same  Proc: Bilateral nasal valve repair, Nasal Septoplasty, Bilateral Partial Reduction Inferior Turbinates   Surg:  Noorah Giammona H  Anes:  GOT  EBL:  50 mL  Comp:  None  Findings: A she has collapsed nasal valves both size at into her nasal obstruction. She has deviated septum that had inferior spur on the right side and the ethmoid plate buckled towards the right as well. The left inferior turbinate especially was overgrown.  Procedure: With the patient in a comfortable supine position,  general orotracheal anesthesia was induced without difficulty.     The patient received preoperative Afrin spray for topical decongestion and vasoconstriction.  Intravenous prophylactic antibiotics were administered.  At an appropriate level, the patient was placed in a semi-sitting position.  Nasal vibrissae were trimmed.   1/4% marcaine  with 1:100,000 epinephrine, 4 cc's, was infiltrated into the anterior floor of the nose, into the nasal spine region, into the membranous columella, and finally into the submucoperichondrial plane of the septum on both sides.  Several minutes were allowed for this to take effect.  Cottoniod pledgetts soaked in Afrin and 4% Xylocaine were placed into both nasal cavities and left while the patient was prepped and draped in the standard fashion.  The materials were removed from the nose and observed to be intact and correct in number.  The nose was inspected with a headlight and with a 0 scope with the findings as described above.  A left Killian incision was sharply executed and carried down to the quadrangular cartilage. The mucoperichondrium was elelvated along the quadrangular plate back to the bony-cartilaginous junction. The mucoperiostium was then elevated along the ethmoid plate and the vomer. The  boney-catilaginous junction was then split with a freer elevator and the mucoperiosteum was elevated on the opposite side. The mucoperiosteum was then elevated along the maxillary crest as needed to expose the crooked bone of the crest.  Boney spurs of the vomer and maxillary crest were removed with Lenoria Chimeakahashi forceps. A chisel was used to remove the overhanging maxillary crest on the right side.  The cartilaginous plate was trimmed along its posterior and inferior borders of about 2 mm of cartilage to free it up inferiorly. Some of the deviated ethmoid plate was then fractured and removed with Takahashi forceps to free up the posterior border of the quadrangular plate and allow it to swing back to the midline. The mucosal flaps were placed back into their anatomic position to allow visualization of the airways. The septum now sat in the midline with an improved airway.  A 3-0 Chromic suture on a Keith needle in used to anchor the inferior septum at the nasal spine with a through and through suture. The mucosal flaps are then sutured together using a through and through whip stitch of 4-0 Plain Gut  a mini-Keith needle. This was used to close the PineviewKillian incision as well.   The inferior turbinates were then inspected. An incision was created along the inferior aspect of the left inferior turbinate with removal of some of the inferior soft tissue and bone. Electrocautery was used to control bleeding in the area. The remaining turbinate was then outfractured to open up the airway further. There was no significant bleeding noted. The right turbinate was then trimmed and outfractured in a similar fashion.  The airways were then visualized and  showed open passageways on both sides that were significantly improved compared to before surgery. There was no signifcant bleeding. Nasal splints were applied to both sides of the septum using Xomed 0.38mm regular sized splints that were trimmed, and then held in position  with a 3-0 Nylon through and through suture.  The nose was then and marked along its nasal dorsum on both sides and the maximum amount of nasal valve collapse was occurring. The nasal bones were marked and the nose was marked where the implant needed to sit to give maximum benefit. The nose previously been washed with alcohol. A quarter cc of local anesthesia was placed on both sides the nose following along the anticipated line of dissection. The cannula was then placed just inside the nasal alar rim and dissected along the right side beneath the subcutaneous but above the upper lateral cartilage and overlying the bone. Its previously marked area then the Latera implant was deployed. The dissecting cannula was removed and the implant was observed to be in the correct placement. This procedure was then repeated on the left side using the dissecting cannula to create a tunnel beneath the skin but above the cartilage and above the nasal bone to the correct level of dissection. The implant was then deployed and was found to be sitting in the proper position. There was no significant bleeding on either side.  The patient was turned back over to anesthesia, and awakened, extubated, and taken to the PACU in satisfactory condition.  Dispo:   PACU to home  Plan: Ice, elevation, narcotic analgesia, steroid taper, and prophylactic antibiotics for the duration of indwelling nasal foreign bodies.  We will reevaluate the patient in the office in 6 days and remove the septal splints.  Return to work in 10 days, strenuous activities in two weeks.   Anjani Feuerborn H 01/08/2017 1:19 PM

## 2017-01-08 NOTE — Anesthesia Postprocedure Evaluation (Signed)
Anesthesia Post Note  Patient: Caroline Hammond  Procedure(s) Performed: Procedure(s) (LRB): SEPTOPLASTY (Bilateral) bilateral inferior turbinate reduction (Bilateral) NASAL VALVE REPAIR (Bilateral)  Patient location during evaluation: PACU Anesthesia Type: General Level of consciousness: awake and alert and oriented Pain management: satisfactory to patient Vital Signs Assessment: post-procedure vital signs reviewed and stable Respiratory status: spontaneous breathing, nonlabored ventilation and respiratory function stable Cardiovascular status: blood pressure returned to baseline and stable Postop Assessment: Adequate PO intake and No signs of nausea or vomiting Anesthetic complications: no    Cherly BeachStella, Joanann Mies J

## 2017-01-08 NOTE — Anesthesia Procedure Notes (Signed)
Procedure Name: Intubation Date/Time: 01/08/2017 12:06 PM Performed by: Londell Moh Pre-anesthesia Checklist: Patient identified, Emergency Drugs available, Suction available, Patient being monitored and Timeout performed Patient Re-evaluated:Patient Re-evaluated prior to inductionOxygen Delivery Method: Circle system utilized Preoxygenation: Pre-oxygenation with 100% oxygen Intubation Type: IV induction Ventilation: Mask ventilation without difficulty Laryngoscope Size: Mac and 3 Grade View: Grade II Tube type: Oral Rae Tube size: 7.0 mm Number of attempts: 1 Airway Equipment and Method: Bougie stylet Placement Confirmation: ETT inserted through vocal cords under direct vision,  positive ETCO2 and breath sounds checked- equal and bilateral Tube secured with: Tape Dental Injury: Teeth and Oropharynx as per pre-operative assessment  Difficulty Due To: Difficulty was unanticipated and Difficult Airway- due to anterior larynx

## 2017-01-08 NOTE — H&P (Signed)
  H&P has been reviewed and the patient reexamined, and no changes necessary. To be downloaded later. 

## 2017-01-08 NOTE — Transfer of Care (Signed)
Immediate Anesthesia Transfer of Care Note  Patient: Caroline Hammond  Procedure(s) Performed: Procedure(s) with comments: SEPTOPLASTY (Bilateral) - Latera bilateral inferior turbinate reduction (Bilateral) NASAL VALVE REPAIR (Bilateral)  Patient Location: PACU  Anesthesia Type: General ETT  Level of Consciousness: awake, alert  and patient cooperative  Airway and Oxygen Therapy: Patient Spontanous Breathing and Patient connected to supplemental oxygen  Post-op Assessment: Post-op Vital signs reviewed, Patient's Cardiovascular Status Stable, Respiratory Function Stable, Patent Airway and No signs of Nausea or vomiting  Post-op Vital Signs: Reviewed and stable  Complications: No apparent anesthesia complications

## 2017-01-08 NOTE — Anesthesia Preprocedure Evaluation (Addendum)
Anesthesia Evaluation  Patient identified by MRN, date of birth, ID band Patient awake    Reviewed: Allergy & Precautions, H&P , NPO status , Patient's Chart, lab work & pertinent test results  Airway Mallampati: II  TM Distance: >3 FB Neck ROM: full    Dental no notable dental hx.    Pulmonary    Pulmonary exam normal        Cardiovascular Normal cardiovascular exam     Neuro/Psych    GI/Hepatic   Endo/Other    Renal/GU      Musculoskeletal   Abdominal   Peds  Hematology   Anesthesia Other Findings   Reproductive/Obstetrics                             Anesthesia Physical Anesthesia Plan  ASA: II  Anesthesia Plan: General ETT   Post-op Pain Management:    Induction:   Airway Management Planned:   Additional Equipment:   Intra-op Plan:   Post-operative Plan:   Informed Consent: I have reviewed the patients History and Physical, chart, labs and discussed the procedure including the risks, benefits and alternatives for the proposed anesthesia with the patient or authorized representative who has indicated his/her understanding and acceptance.     Plan Discussed with:   Anesthesia Plan Comments:         Anesthesia Quick Evaluation  

## 2018-03-23 ENCOUNTER — Other Ambulatory Visit: Payer: Self-pay | Admitting: Family Medicine

## 2018-03-23 DIAGNOSIS — Z1231 Encounter for screening mammogram for malignant neoplasm of breast: Secondary | ICD-10-CM

## 2018-04-13 ENCOUNTER — Ambulatory Visit
Admission: RE | Admit: 2018-04-13 | Discharge: 2018-04-13 | Disposition: A | Discharge status: Home / Self Care | Payer: Medicare Other | Source: Ambulatory Visit | Attending: Family Medicine | Admitting: Family Medicine

## 2018-04-13 DIAGNOSIS — Z1231 Encounter for screening mammogram for malignant neoplasm of breast: Secondary | ICD-10-CM

## 2019-05-23 ENCOUNTER — Other Ambulatory Visit: Payer: Self-pay

## 2019-05-23 DIAGNOSIS — Z20822 Contact with and (suspected) exposure to covid-19: Secondary | ICD-10-CM

## 2019-05-24 LAB — NOVEL CORONAVIRUS, NAA: SARS-CoV-2, NAA: NOT DETECTED

## 2019-05-24 LAB — SPECIMEN STATUS REPORT

## 2019-07-18 ENCOUNTER — Ambulatory Visit
Admission: RE | Admit: 2019-07-18 | Discharge: 2019-07-18 | Disposition: A | Discharge status: Home / Self Care | Payer: Medicare Other | Source: Ambulatory Visit | Attending: Family Medicine | Admitting: Family Medicine

## 2019-07-18 ENCOUNTER — Other Ambulatory Visit: Payer: Self-pay | Admitting: Family Medicine

## 2019-07-18 DIAGNOSIS — Z1231 Encounter for screening mammogram for malignant neoplasm of breast: Secondary | ICD-10-CM | POA: Insufficient documentation

## 2019-11-19 IMAGING — MG MM DIGITAL SCREENING BILAT W/ TOMO W/ CAD
6 of 10 series · 6 of 30 positions shown · non-contrast
Comparison: Previous exam(s).

CLINICAL DATA: Screening.

EXAM:
DIGITAL SCREENING BILATERAL MAMMOGRAM WITH TOMO AND CAD

[R CC synth-2D]
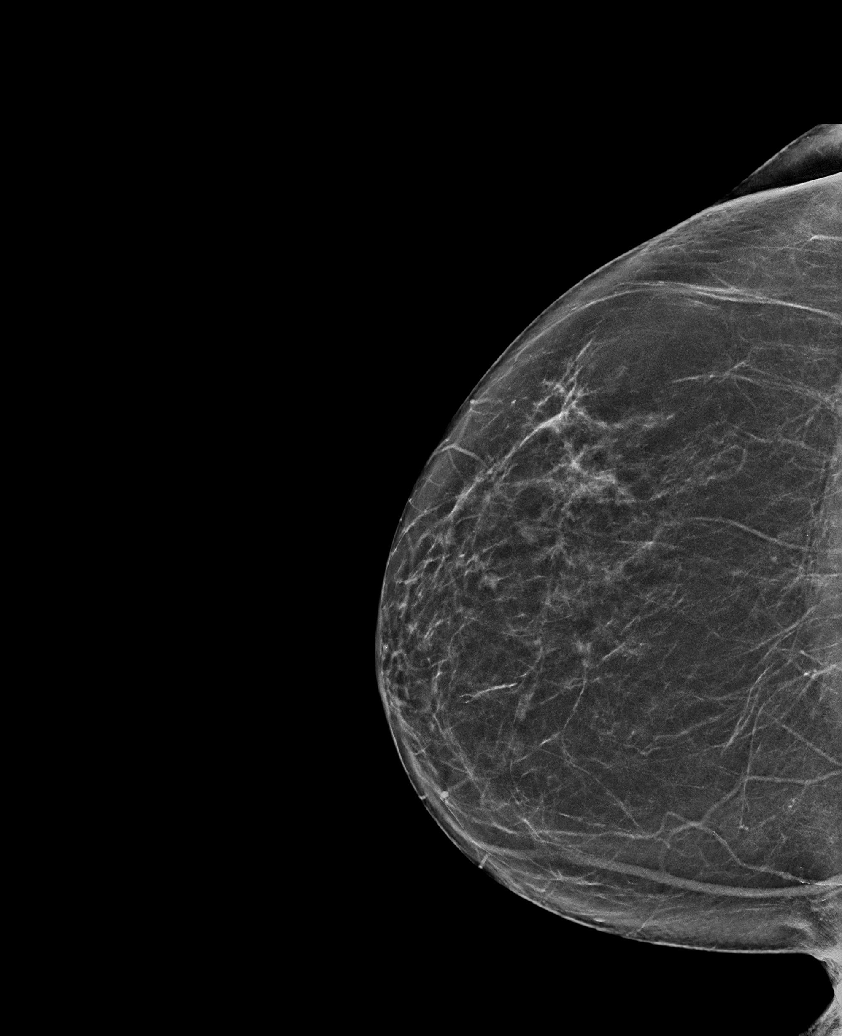

[L MLO synth-2D]
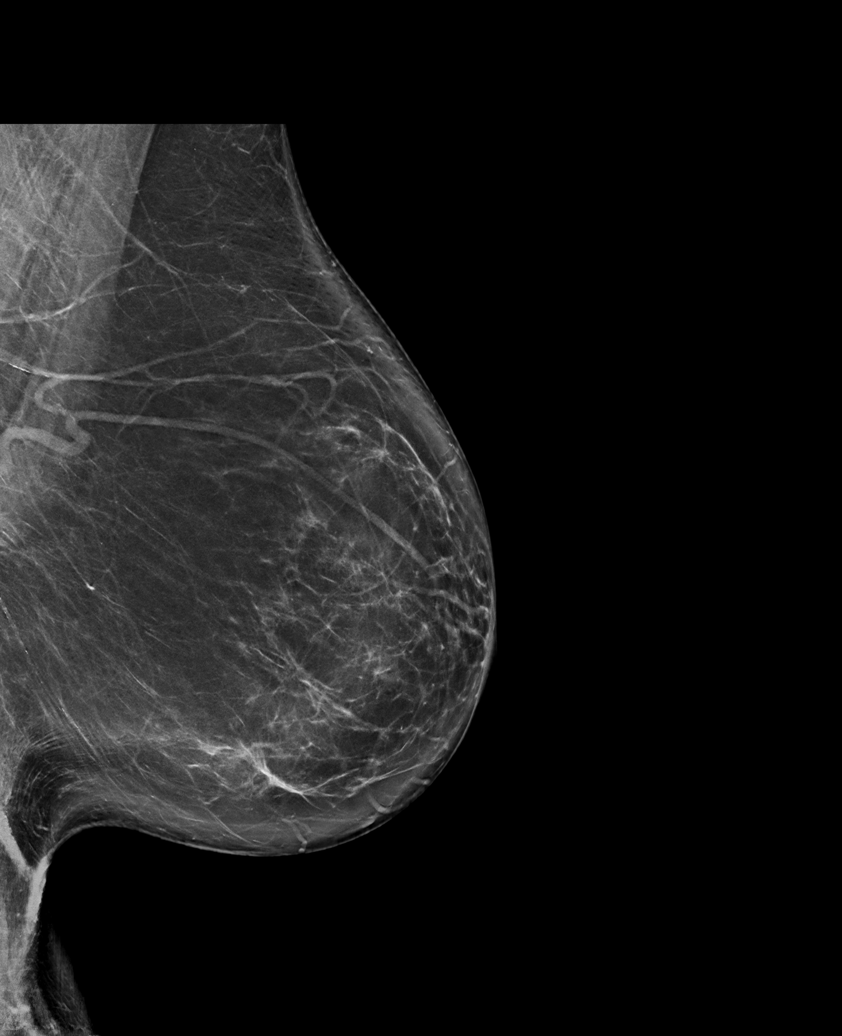

[R MLO synth-2D (1 of 2)]
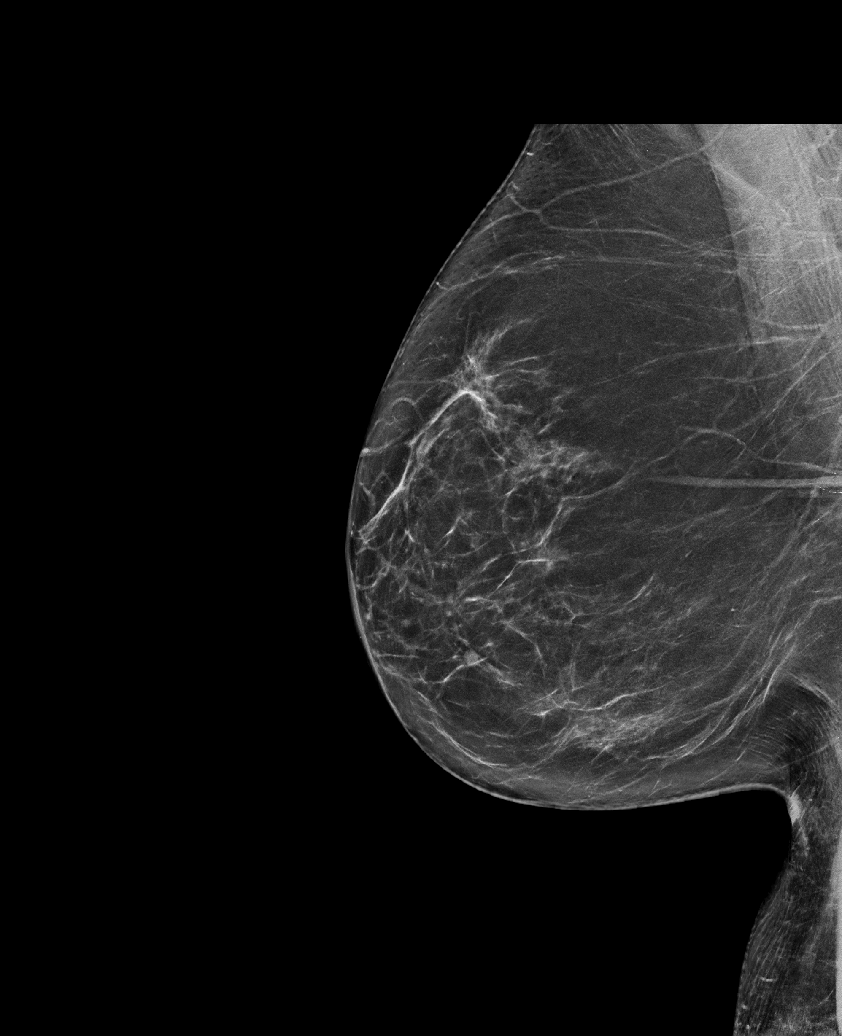

[R MLO synth-2D (2 of 2)]
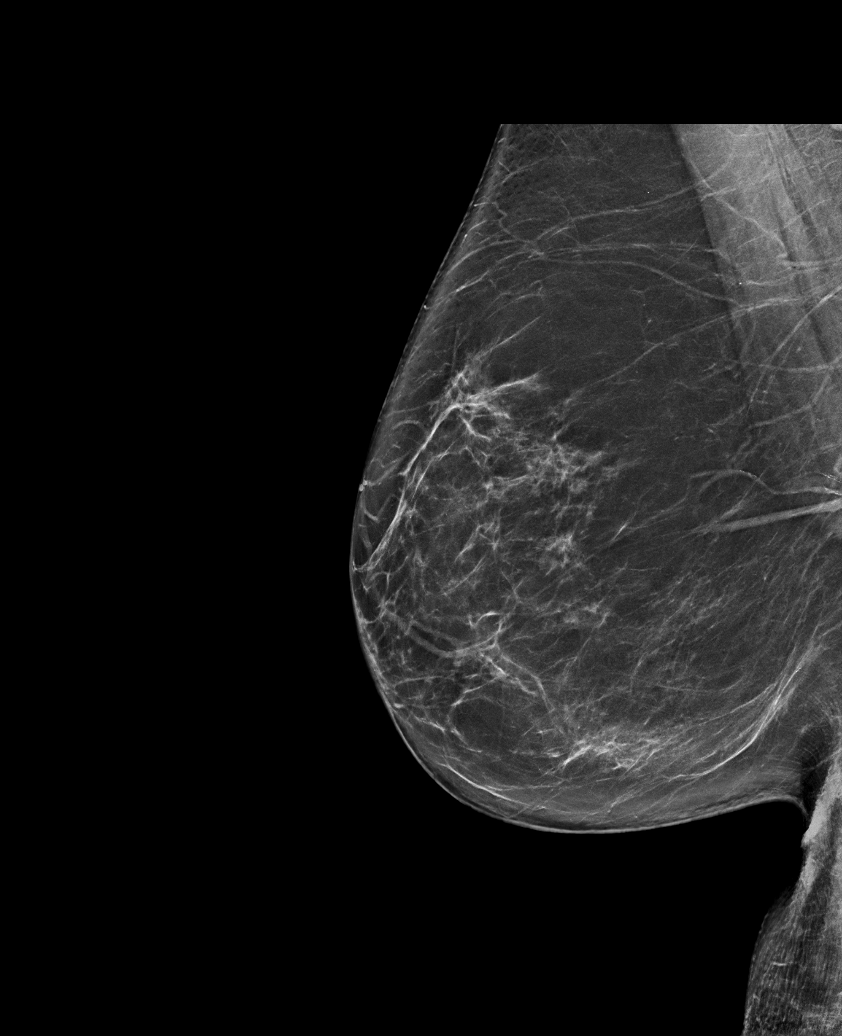

[L CC synth-2D]
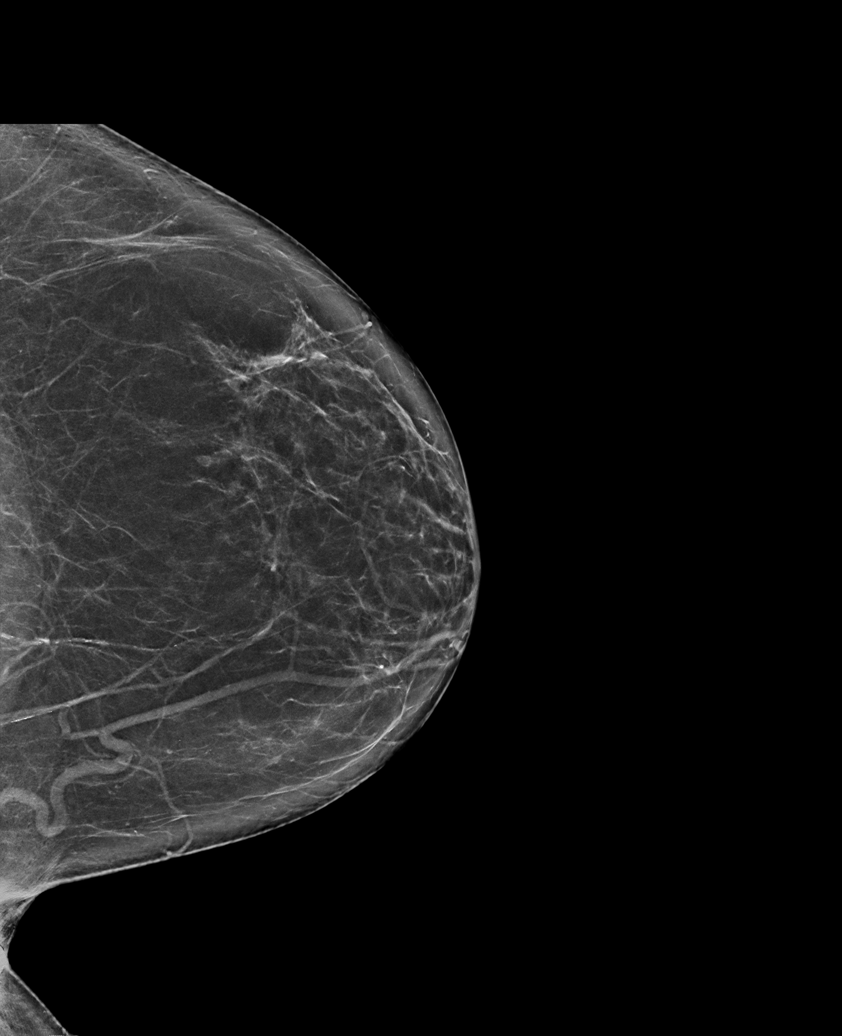

[R MLO tomo · tomo slice 39/77.0]
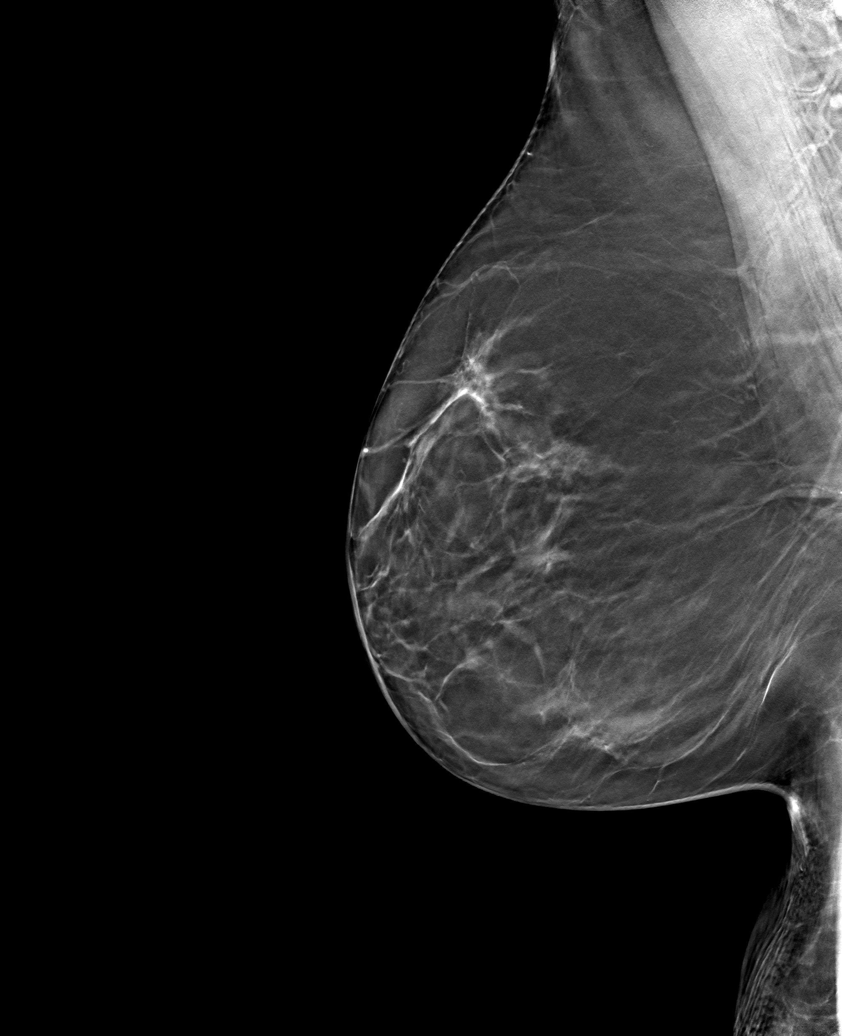

[6 of 30 positions shown; findings below may reference images not displayed]

ACR Breast Density Category b: There are scattered areas of
fibroglandular density.
FINDINGS: There are no findings suspicious for malignancy. Images were
processed with CAD.
IMPRESSION: No mammographic evidence of malignancy. A result letter of this
screening mammogram will be mailed directly to the patient.

RECOMMENDATION:
Screening mammogram in one year. (Code:CN-U-775)

BI-RADS CATEGORY  1: Negative.

## 2020-02-08 ENCOUNTER — Ambulatory Visit: Payer: Medicare Other

## 2020-02-08 ENCOUNTER — Ambulatory Visit: Payer: Medicare Other | Admitting: Podiatry

## 2020-02-08 ENCOUNTER — Other Ambulatory Visit: Payer: Self-pay

## 2020-02-08 ENCOUNTER — Encounter: Payer: Self-pay | Admitting: Podiatry

## 2020-02-08 VITALS — Temp 97.9°F

## 2020-02-08 DIAGNOSIS — M898X7 Other specified disorders of bone, ankle and foot: Secondary | ICD-10-CM | POA: Diagnosis not present

## 2020-02-08 DIAGNOSIS — Q828 Other specified congenital malformations of skin: Secondary | ICD-10-CM | POA: Diagnosis not present

## 2020-02-08 DIAGNOSIS — M2042 Other hammer toe(s) (acquired), left foot: Secondary | ICD-10-CM | POA: Diagnosis not present

## 2020-02-08 NOTE — Progress Notes (Signed)
Subjective:  Patient ID: Caroline Hammond, female    DOB: Jan 22, 1946,  MRN: [id number] HPI Chief Complaint  Patient presents with  . Callouses    Patient presents today for painful corn on left 5th toe x years, its become worse recently.  She states "it stings and burns, especially when I wear closed toed shoes"  She has used corn pads in the past with temporary relief    74 y.o. female presents with the above complaint.   ROS: Denies fever chills nausea vomiting muscle aches pains calf pain back pain chest pain shortness of breath.  Past Medical History:  Diagnosis Date  . Allergic rhinitis   . Pure hypercholesterolemia    Past Surgical History:  Procedure Laterality Date  . APPENDECTOMY    . CESAREAN SECTION    . COLONOSCOPY WITH PROPOFOL N/A 06/07/2015   Procedure: COLONOSCOPY WITH PROPOFOL;  Surgeon: Christena Deem, MD;  Location: [facility] ENDOSCOPY;  Service: Endoscopy;  Laterality: N/A;  . SEPTOPLASTY Bilateral 01/08/2017   Procedure: SEPTOPLASTY;  Surgeon: Vernie Murders, MD;  Location: [facility];  Service: ENT;  Laterality: Bilateral;  Latera  . TURBINATE REDUCTION Bilateral 01/08/2017   Procedure: bilateral inferior turbinate reduction;  Surgeon: Vernie Murders, MD;  Location: [facility];  Service: ENT;  Laterality: Bilateral;    Current Outpatient Medications:  .  loratadine-pseudoephedrine (CLARITIN-D 24-HOUR) 10-240 MG 24 hr tablet, Take 1 tablet by mouth daily., Disp: , Rfl:  .  mometasone (NASONEX) 50 MCG/ACT nasal spray, Place 2 sprays into the nose daily., Disp: , Rfl:   No Known Allergies Review of Systems Objective:   Vitals:   02/08/20 0905  Temp: 97.9 F (36.6 C)    General: Well developed, nourished, in no acute distress, alert and oriented x3   Dermatological: Skin is warm, dry and supple bilateral. Nails x 10 are well maintained; remaining integument appears unremarkable at this time. There are no open sores, no preulcerative lesions, no  rash or signs of infection present.  Vascular: Dorsalis Pedis artery and Posterior Tibial artery pedal pulses are 2/4 bilateral with immedate capillary fill time. Pedal hair growth present. No varicosities and no lower extremity edema present bilateral.   Neruologic: Grossly intact via light touch bilateral. Vibratory intact via tuning fork bilateral. Protective threshold with Semmes Wienstein monofilament intact to all pedal sites bilateral. Patellar and Achilles deep tendon reflexes 2+ bilateral. No Babinski or clonus noted bilateral.   Musculoskeletal: No gross boney pedal deformities bilateral. No pain, crepitus, or limitation noted with foot and ankle range of motion bilateral. Muscular strength 5/5 in all groups tested bilateral.  She has an adductovarus rotated hammertoe deformity fifth left.  Minimally rigid at the PIPJ secondary to osteoarthritis.  Also has a medial spur with palpable at the level of the distal phalanx.  Gait: Unassisted, Nonantalgic.    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  Also medial spurring resulting in soft tissue callus medial aspect of the fifth toe at the distal phalanx.  Plan: Discussed etiology pathology conservative surgical therapies debrided the lesion today.  Consented her today for derotational arthroplasty and exostectomy of the fifth toe medial side.  She understands this and is amenable to it we did discuss the possible postop complications which may include but not limited to postop pain bleeding swelling infection recurrence need for further surgery overcorrection under correction loss of digit loss  of limb loss of life.  Dispensed a Darco shoe information regarding the surgery center and anesthesia group will follow up with her in late June for surgery.     Amber Williard T. [place], [place]

## 2020-02-08 NOTE — Patient Instructions (Signed)
Pre-Operative Instructions  Congratulations, you have decided to take an important step towards improving your quality of life.  You can be assured that the doctors and staff at [facility] will be with you every step of the way.  Here are some important things you should know:  1. Plan to be at the surgery center/hospital at least 1 (one) hour prior to your scheduled time, unless otherwise directed by the surgical center/hospital staff.  You must have a responsible adult accompany you, remain during the surgery and drive you home.  Make sure you have directions to the surgical center/hospital to ensure you arrive on time. 2. If you are having surgery at [facility] or [facility], you will need a copy of your medical history and physical form from your family physician within one month prior to the date of surgery. We will give you a form for your primary physician to complete.  3. We make every effort to accommodate the date you request for surgery.  However, there are times where surgery dates or times have to be moved.  We will contact you as soon as possible if a change in schedule is required.   4. No aspirin/ibuprofen for one week before surgery.  If you are on aspirin, any non-steroidal anti-inflammatory medications (Mobic, Aleve, Ibuprofen) should not be taken seven (7) days prior to your surgery.  You make take Tylenol for pain prior to surgery.  5. Medications - If you are taking daily heart and blood pressure medications, seizure, reflux, allergy, asthma, anxiety, pain or diabetes medications, make sure you notify the surgery center/hospital before the day of surgery so they can tell you which medications you should take or avoid the day of surgery. 6. No food or drink after midnight the night before surgery unless directed otherwise by surgical center/hospital staff. 7. No alcoholic beverages 24-hours prior to surgery.  No smoking 24-hours prior or 24-hours after  surgery. 8. Wear loose pants or shorts. They should be loose enough to fit over bandages, boots, and casts. 9. Don't wear slip-on shoes. Sneakers are preferred. 10. Bring your boot with you to the surgery center/hospital.  Also bring crutches or a walker if your physician has prescribed it for you.  If you do not have this equipment, it will be provided for you after surgery. 11. If you have not been contacted by the surgery center/hospital by the day before your surgery, call to confirm the date and time of your surgery. 12. Leave-time from work may vary depending on the type of surgery you have.  Appropriate arrangements should be made prior to surgery with your employer. 13. Prescriptions will be provided immediately following surgery by your doctor.  Fill these as soon as possible after surgery and take the medication as directed. Pain medications will not be refilled on weekends and must be approved by the doctor. 14. Remove nail polish on the operative foot and avoid getting pedicures prior to surgery. 15. Wash the night before surgery.  The night before surgery wash the foot and leg well with water and the antibacterial soap provided. Be sure to pay special attention to beneath the toenails and in between the toes.  Wash for at least three (3) minutes. Rinse thoroughly with water and dry well with a towel.  Perform this wash unless told not to do so by your physician.  Enclosed: 1 Ice pack (please put in freezer the night before surgery)   1 Hibiclens skin cleaner     Pre-op instructions  If you have any questions regarding the instructions, please do not hesitate to call our office.  [place]: [address], [place], [place] [address] -- [phone]  [place]: [address] [place], [place] [address] -- [phone]  [place]: [address], [place], [place] [address] -- [phone]   Website: [email or web address] 

## 2020-03-26 ENCOUNTER — Telehealth: Payer: Self-pay

## 2020-03-26 NOTE — Telephone Encounter (Signed)
Patient called to cancel her surgery on 04/20/20. She stated she has to many conflicts right now. She will call back later to reschedule her surgery. Left message for Aram Beecham at [facility] of cancellation.

## 2020-03-29 NOTE — Telephone Encounter (Signed)
thanks

## 2020-04-18 ENCOUNTER — Encounter: Payer: Medicare Other | Admitting: Podiatry

## 2020-04-26 ENCOUNTER — Encounter: Payer: Medicare Other | Admitting: Podiatry

## 2020-05-02 ENCOUNTER — Encounter: Payer: Medicare Other | Admitting: Podiatry

## 2020-05-09 ENCOUNTER — Encounter: Payer: Medicare Other | Admitting: Podiatry

## 2020-05-16 ENCOUNTER — Encounter: Payer: Medicare Other | Admitting: Podiatry

## 2020-05-23 ENCOUNTER — Encounter: Payer: Medicare Other | Admitting: Podiatry

## 2020-05-30 ENCOUNTER — Encounter: Payer: Medicare Other | Admitting: Podiatry

## 2020-12-31 ENCOUNTER — Other Ambulatory Visit: Payer: Self-pay | Admitting: Family Medicine

## 2020-12-31 DIAGNOSIS — Z1231 Encounter for screening mammogram for malignant neoplasm of breast: Secondary | ICD-10-CM

## 2021-01-15 ENCOUNTER — Ambulatory Visit
Admission: RE | Admit: 2021-01-15 | Discharge: 2021-01-15 | Disposition: A | Discharge status: Home / Self Care | Payer: Medicare Other | Source: Ambulatory Visit | Attending: Family Medicine | Admitting: Family Medicine

## 2021-01-15 ENCOUNTER — Other Ambulatory Visit: Payer: Self-pay

## 2021-01-15 DIAGNOSIS — Z1231 Encounter for screening mammogram for malignant neoplasm of breast: Secondary | ICD-10-CM | POA: Insufficient documentation

## 2021-12-30 ENCOUNTER — Other Ambulatory Visit: Payer: Self-pay | Admitting: Family Medicine

## 2021-12-30 DIAGNOSIS — Z1231 Encounter for screening mammogram for malignant neoplasm of breast: Secondary | ICD-10-CM

## 2022-02-11 ENCOUNTER — Ambulatory Visit
Admission: RE | Admit: 2022-02-11 | Discharge: 2022-02-11 | Disposition: A | Discharge status: Home / Self Care | Payer: Medicare Other | Source: Ambulatory Visit | Attending: Family Medicine | Admitting: Family Medicine

## 2022-02-11 DIAGNOSIS — Z1231 Encounter for screening mammogram for malignant neoplasm of breast: Secondary | ICD-10-CM | POA: Diagnosis not present

## 2023-05-12 ENCOUNTER — Ambulatory Visit (LOCAL_COMMUNITY_HEALTH_CENTER): Payer: Medicare Other

## 2023-05-12 DIAGNOSIS — Z23 Encounter for immunization: Secondary | ICD-10-CM | POA: Diagnosis not present

## 2023-05-12 DIAGNOSIS — Z719 Counseling, unspecified: Secondary | ICD-10-CM

## 2023-05-12 NOTE — Progress Notes (Signed)
Client seen in nurse clinic for COVID-19 vaccine.   VIS provided. All questions answered.  Vaccine administered in left Deltoid and tolerated well.  Ezekial Arns Sherrilyn Rist, RN

## 2023-08-11 ENCOUNTER — Ambulatory Visit: Payer: Medicare Other

## 2023-08-11 DIAGNOSIS — Z23 Encounter for immunization: Secondary | ICD-10-CM

## 2023-08-11 DIAGNOSIS — Z719 Counseling, unspecified: Secondary | ICD-10-CM

## 2023-08-11 NOTE — Progress Notes (Signed)
Client seen in nurse clinic for COVID-19 vaccines.  Discussed RSV vaccine.  Client plans to wait for RSV for another time.   Vaccine administered and tolerated well.  Navika Hoopes Sherrilyn Rist, RN

## 2023-10-28 ENCOUNTER — Other Ambulatory Visit: Payer: Self-pay | Admitting: Family Medicine

## 2023-10-28 DIAGNOSIS — Z1231 Encounter for screening mammogram for malignant neoplasm of breast: Secondary | ICD-10-CM

## 2023-11-19 ENCOUNTER — Ambulatory Visit
Admission: RE | Admit: 2023-11-19 | Discharge: 2023-11-19 | Disposition: A | Discharge status: Home / Self Care | Payer: Medicare Other | Source: Ambulatory Visit | Attending: Family Medicine | Admitting: Family Medicine

## 2023-11-19 DIAGNOSIS — Z1231 Encounter for screening mammogram for malignant neoplasm of breast: Secondary | ICD-10-CM | POA: Diagnosis present

## 2024-02-02 ENCOUNTER — Ambulatory Visit

## 2024-02-02 DIAGNOSIS — Z23 Encounter for immunization: Secondary | ICD-10-CM

## 2024-02-02 DIAGNOSIS — Z719 Counseling, unspecified: Secondary | ICD-10-CM

## 2024-02-02 NOTE — Progress Notes (Signed)
 Patient seen in nurse clinic for COVID vaccine.  Last COVID vaccine 08/11/2023.  Consult with Dr. Tanis Fan regarding administration 1 week early for 6 month since last COVID vaccine. Dr. Tanis Fan approved administration. Comirnaty 2024-25 given and tolerated well.  [facility] updated and copy provided. Waited 10 minutes. Discussed if a new COVID vaccine it would probably not be available until the fall.

## 2024-06-03 ENCOUNTER — Other Ambulatory Visit: Payer: Self-pay | Admitting: Family Medicine

## 2024-06-03 DIAGNOSIS — Z0000 Encounter for general adult medical examination without abnormal findings: Secondary | ICD-10-CM

## 2024-06-03 DIAGNOSIS — E7800 Pure hypercholesterolemia, unspecified: Secondary | ICD-10-CM

## 2024-06-16 ENCOUNTER — Ambulatory Visit
Admission: RE | Admit: 2024-06-16 | Discharge: 2024-06-16 | Disposition: A | Discharge status: Home / Self Care | Payer: Self-pay | Source: Ambulatory Visit | Attending: Family Medicine | Admitting: Family Medicine

## 2024-06-16 DIAGNOSIS — E7800 Pure hypercholesterolemia, unspecified: Secondary | ICD-10-CM | POA: Insufficient documentation

## 2024-06-16 DIAGNOSIS — Z0000 Encounter for general adult medical examination without abnormal findings: Secondary | ICD-10-CM | POA: Insufficient documentation
